# Patient Record
Sex: Male | Born: 1985 | Race: Black or African American | Hispanic: No | Marital: Single | Smoking: Current some day smoker
Health system: Southern US, Community
[De-identification: ages and names within clinical notes are randomized; demographics above are authoritative.]

---

## 2009-12-18 ENCOUNTER — Emergency Department (HOSPITAL_COMMUNITY): Admission: EM | Admit: 2009-12-18 | Discharge: 2009-12-18 | Payer: Self-pay | Admitting: Emergency Medicine

## 2010-07-21 ENCOUNTER — Emergency Department (HOSPITAL_COMMUNITY)
Admission: EM | Admit: 2010-07-21 | Discharge: 2010-07-22 | Payer: Self-pay | Source: Home / Self Care | Admitting: Emergency Medicine

## 2014-09-08 ENCOUNTER — Encounter (HOSPITAL_COMMUNITY): Payer: Self-pay | Admitting: *Deleted

## 2014-09-08 ENCOUNTER — Emergency Department (HOSPITAL_COMMUNITY): Payer: 59

## 2014-09-08 ENCOUNTER — Emergency Department (HOSPITAL_COMMUNITY)
Admission: EM | Admit: 2014-09-08 | Discharge: 2014-09-08 | Disposition: A | Discharge status: Home / Self Care | Payer: 59 | Attending: Emergency Medicine | Admitting: Emergency Medicine

## 2014-09-08 DIAGNOSIS — Z87891 Personal history of nicotine dependence: Secondary | ICD-10-CM | POA: Diagnosis not present

## 2014-09-08 DIAGNOSIS — R05 Cough: Secondary | ICD-10-CM | POA: Diagnosis present

## 2014-09-08 DIAGNOSIS — J159 Unspecified bacterial pneumonia: Secondary | ICD-10-CM | POA: Insufficient documentation

## 2014-09-08 DIAGNOSIS — J189 Pneumonia, unspecified organism: Secondary | ICD-10-CM

## 2014-09-08 LAB — COMPREHENSIVE METABOLIC PANEL
ALBUMIN: 4.3 g/dL (ref 3.5–5.2)
ALT: 24 U/L (ref 0–53)
AST: 20 U/L (ref 0–37)
Alkaline Phosphatase: 48 U/L (ref 39–117)
Anion gap: 12 (ref 5–15)
BUN: 9 mg/dL (ref 6–23)
CALCIUM: 8.9 mg/dL (ref 8.4–10.5)
CHLORIDE: 102 mmol/L (ref 96–112)
CO2: 22 mmol/L (ref 19–32)
Creatinine, Ser: 1 mg/dL (ref 0.50–1.35)
GFR calc Af Amer: >90 mL/min (ref >90)
GLUCOSE: 100 mg/dL — AB (ref 70–99)
POTASSIUM: 3.7 mmol/L (ref 3.5–5.1)
SODIUM: 136 mmol/L (ref 135–145)
TOTAL PROTEIN: 7.9 g/dL (ref 6.0–8.3)
Total Bilirubin: 1.5 mg/dL — ABNORMAL HIGH (ref 0.3–1.2)

## 2014-09-08 LAB — CBC WITH DIFFERENTIAL/PLATELET
BASOS ABS: 0 10*3/uL (ref 0.0–0.1)
Basophils Relative: 0 % (ref 0–1)
EOS PCT: 0 % (ref 0–5)
Eosinophils Absolute: 0 10*3/uL (ref 0.0–0.7)
HEMATOCRIT: 40.4 % (ref 39.0–52.0)
Hemoglobin: 13.4 g/dL (ref 13.0–17.0)
LYMPHS PCT: 7 % — AB (ref 12–46)
Lymphs Abs: 0.7 10*3/uL (ref 0.7–4.0)
MCH: 29.1 pg (ref 26.0–34.0)
MCHC: 33.2 g/dL (ref 30.0–36.0)
MCV: 87.8 fL (ref 78.0–100.0)
MONO ABS: 1.3 10*3/uL — AB (ref 0.1–1.0)
Monocytes Relative: 13 % — ABNORMAL HIGH (ref 3–12)
Neutro Abs: 7.7 10*3/uL (ref 1.7–7.7)
Neutrophils Relative %: 80 % — ABNORMAL HIGH (ref 43–77)
Platelets: 242 10*3/uL (ref 150–400)
RBC: 4.6 MIL/uL (ref 4.22–5.81)
RDW: 12.2 % (ref 11.5–15.5)
WBC: 9.6 10*3/uL (ref 4.0–10.5)

## 2014-09-08 LAB — I-STAT CG4 LACTIC ACID, ED: Lactic Acid, Venous: 0.83 mmol/L (ref 0.5–2.0)

## 2014-09-08 MED ORDER — ACETAMINOPHEN 325 MG PO TABS
650.0000 mg | ORAL_TABLET | Freq: Four times a day (QID) | ORAL | Status: DC | PRN
  Administered 2014-09-08: 650 mg via ORAL

## 2014-09-08 MED ORDER — ACETAMINOPHEN 325 MG PO TABS
ORAL_TABLET | ORAL | Status: AC
  Filled 2014-09-08: qty 2

## 2014-09-08 MED ORDER — AZITHROMYCIN 250 MG PO TABS: 250.0000 mg | ORAL_TABLET | Freq: Every day | ORAL | Status: AC

## 2014-09-08 MED ORDER — ALBUTEROL SULFATE HFA 108 (90 BASE) MCG/ACT IN AERS
2.0000 | INHALATION_SPRAY | RESPIRATORY_TRACT | Status: DC | PRN
  Administered 2014-09-08: 2 via RESPIRATORY_TRACT
  Filled 2014-09-08: qty 6.7

## 2014-09-08 MED ORDER — AZITHROMYCIN 250 MG PO TABS
500.0000 mg | ORAL_TABLET | Freq: Once | ORAL | Status: AC
  Administered 2014-09-08: 500 mg via ORAL
  Filled 2014-09-08: qty 2

## 2014-09-08 NOTE — ED Notes (Signed)
Pt in c/o cough and congestion with fever since yesterday, also reports episode of n/v tonight, last had tylenol around 10am today, no distress noted

## 2014-09-08 NOTE — ED Provider Notes (Signed)
CSN: [id number]     Arrival date & time 09/08/14  2043 History  This chart was scribed for non-physician practitioner Ivar Drapeob Shakeela Rabadan, PA-C working with Rolland PorterMark James, MD by Murriel HopperAlec Bankhead, ED Scribe. This patient was seen in room TR08C/TR08C and the patient's care was started at 10:08 PM.    Chief Complaint  Patient presents with  . Cough  . Fever      The history is provided by the patient. No language interpreter was used.     HPI Comments: Juan RoughBrian Tarry is a 29 y.o. male who presents to the Emergency Department complaining of worsening cough with associated congestion, fever, and chills that has been present since yesterday. Pt states that last night while he was at work he began to get chills and woke up this morning feeling worse. Pt states that his son has had similar symptoms lately. Pt also reports having nausea and on episode of vomiting tonight. Patient denies any recent hospitalizations or surgeries. Denies any history of asthma or COPD. He states that he does smoke occasionally.    History reviewed. No pertinent past medical history. History reviewed. No pertinent past surgical history. History reviewed. No pertinent family history. History  Substance Use Topics  . Smoking status: Former Games developermoker  . Smokeless tobacco: Not on file  . Alcohol Use: Not on file    Review of Systems  Constitutional: Positive for fever and chills.  HENT: Positive for congestion.   Respiratory: Positive for cough.   Gastrointestinal: Positive for vomiting.      Allergies  Review of patient's allergies indicates no known allergies.  Home Medications   Prior to Admission medications   Not on File   BP 127/69 mmHg  Pulse 113  Temp(Src) 100.5 F (38.1 C) (Oral)  Resp 18  Ht 5\' 8"  (1.727 m)  Wt 200 lb (90.719 kg)  BMI 30.42 kg/m2  SpO2 97% Physical Exam  Constitutional: He is oriented to person, place, and time. He appears well-developed and well-nourished.  HENT:  Head:  Normocephalic and atraumatic.  Eyes: Conjunctivae and EOM are normal. Pupils are equal, round, and reactive to light. Right eye exhibits no discharge. Left eye exhibits no discharge. No scleral icterus.  Neck: Normal range of motion. Neck supple. No JVD present.  Cardiovascular: Normal rate, regular rhythm and normal heart sounds.  Exam reveals no gallop and no friction rub.   No murmur heard. Pulmonary/Chest: Effort normal and breath sounds normal. No respiratory distress. He has no wheezes. He has no rales. He exhibits no tenderness.  Clear to auscultation bilaterally  Abdominal: Soft. He exhibits no distension and no mass. There is no tenderness. There is no rebound and no guarding.  Musculoskeletal: Normal range of motion. He exhibits no edema or tenderness.  Neurological: He is alert and oriented to person, place, and time.  Skin: Skin is warm and dry.  Psychiatric: He has a normal mood and affect. His behavior is normal. Judgment and thought content normal.  Nursing note and vitals reviewed.   ED Course  Procedures (including critical care time)  DIAGNOSTIC STUDIES: Oxygen Saturation is 97% on RA, normal by my interpretation.    COORDINATION OF CARE: 10:11 PM Discussed treatment plan with pt at bedside and pt agreed to plan.   Results for orders placed or performed during the hospital encounter of 09/08/14  CBC with Differential  Result Value Ref Range   WBC 9.6 4.0 - 10.5 K/uL   RBC 4.60 4.22 - 5.81 MIL/uL  Hemoglobin 13.4 13.0 - 17.0 g/dL   HCT 96.2 95.2 - 84.1 %   MCV 87.8 78.0 - 100.0 fL   MCH 29.1 26.0 - 34.0 pg   MCHC 33.2 30.0 - 36.0 g/dL   RDW 32.4 40.1 - 02.7 %   Platelets 242 150 - 400 K/uL   Neutrophils Relative % 80 (H) 43 - 77 %   Neutro Abs 7.7 1.7 - 7.7 K/uL   Lymphocytes Relative 7 (L) 12 - 46 %   Lymphs Abs 0.7 0.7 - 4.0 K/uL   Monocytes Relative 13 (H) 3 - 12 %   Monocytes Absolute 1.3 (H) 0.1 - 1.0 K/uL   Eosinophils Relative 0 0 - 5 %    Eosinophils Absolute 0.0 0.0 - 0.7 K/uL   Basophils Relative 0 0 - 1 %   Basophils Absolute 0.0 0.0 - 0.1 K/uL  Comprehensive metabolic panel  Result Value Ref Range   Sodium 136 135 - 145 mmol/L   Potassium 3.7 3.5 - 5.1 mmol/L   Chloride 102 96 - 112 mmol/L   CO2 22 19 - 32 mmol/L   Glucose, Bld 100 (H) 70 - 99 mg/dL   BUN 9 6 - 23 mg/dL   Creatinine, Ser 2.53 0.50 - 1.35 mg/dL   Calcium 8.9 8.4 - 66.4 mg/dL   Total Protein 7.9 6.0 - 8.3 g/dL   Albumin 4.3 3.5 - 5.2 g/dL   AST 20 0 - 37 U/L   ALT 24 0 - 53 U/L   Alkaline Phosphatase 48 39 - 117 U/L   Total Bilirubin 1.5 (H) 0.3 - 1.2 mg/dL   GFR calc non Af Amer >90 >90 mL/min   GFR calc Af Amer >90 >90 mL/min   Anion gap 12 5 - 15  I-Stat CG4 Lactic Acid, ED  Result Value Ref Range   Lactic Acid, Venous 0.83 0.5 - 2.0 mmol/L   Dg Chest 2 View  09/08/2014   CLINICAL DATA:  29 year old male with cough congestion and fever for 1 day.  EXAM: CHEST  2 VIEW  COMPARISON:  12/18/2009  FINDINGS: The cardiomediastinal silhouette is unremarkable.  Mild patchy right lower lobe opacities likely represents pneumonia.  There is no evidence of pulmonary edema, suspicious pulmonary nodule/mass, pleural effusion, or pneumothorax. No acute bony abnormalities are identified.  IMPRESSION: Probable right lower lobe pneumonia.   Electronically Signed   By: Harmon Pier M.D.   On: 09/08/2014 21:52     Imaging Review Dg Chest 2 View  09/08/2014   CLINICAL DATA:  29 year old male with cough congestion and fever for 1 day.  EXAM: CHEST  2 VIEW  COMPARISON:  12/18/2009  FINDINGS: The cardiomediastinal silhouette is unremarkable.  Mild patchy right lower lobe opacities likely represents pneumonia.  There is no evidence of pulmonary edema, suspicious pulmonary nodule/mass, pleural effusion, or pneumothorax. No acute bony abnormalities are identified.  IMPRESSION: Probable right lower lobe pneumonia.   Electronically Signed   By: Harmon Pier M.D.   On:  09/08/2014 21:52     EKG Interpretation None      MDM   Final diagnoses:  CAP (community acquired pneumonia)    Patient with fever, cough and congestion. Will check chest x-ray and basic labs.  Chest x-ray remarkable for a right-sided pneumonia. This is consistent with the patient's symptoms. Will treat with azithromycin for community-acquired pneumonia in an uncomplicated patient. Will give the patient his first dose of azithromycin tonight. Recommend outpatient follow-up. Return precautions given. Patient  understands and agrees with the plan. Advised increasing fluids and rest for several days.  I personally performed the services described in this documentation, which was scribed in my presence. The recorded information has been reviewed and is accurate.  Filed Vitals:   09/08/14 2230  BP: 112/57  Pulse: 95  Temp: 99 F (37.2 C)  Resp: [address] PA-C 09/08/14 2238  Rolland Porter, MD 09/09/14 2211

## 2014-09-08 NOTE — Discharge Instructions (Signed)

## 2014-09-08 NOTE — ED Notes (Signed)
PA assessment and discharge prior to RN assessment.

## 2015-10-14 ENCOUNTER — Emergency Department (HOSPITAL_COMMUNITY): Payer: Commercial Managed Care - HMO

## 2015-10-14 ENCOUNTER — Encounter (HOSPITAL_COMMUNITY): Payer: Self-pay | Admitting: *Deleted

## 2015-10-14 ENCOUNTER — Emergency Department (HOSPITAL_COMMUNITY)
Admission: EM | Admit: 2015-10-14 | Discharge: 2015-10-14 | Disposition: A | Discharge status: Home / Self Care | Payer: Commercial Managed Care - HMO | Attending: Emergency Medicine | Admitting: Emergency Medicine

## 2015-10-14 DIAGNOSIS — Y9289 Other specified places as the place of occurrence of the external cause: Secondary | ICD-10-CM | POA: Diagnosis not present

## 2015-10-14 DIAGNOSIS — Y990 Civilian activity done for income or pay: Secondary | ICD-10-CM | POA: Diagnosis not present

## 2015-10-14 DIAGNOSIS — M25569 Pain in unspecified knee: Secondary | ICD-10-CM

## 2015-10-14 DIAGNOSIS — Z792 Long term (current) use of antibiotics: Secondary | ICD-10-CM | POA: Diagnosis not present

## 2015-10-14 DIAGNOSIS — S83512A Sprain of anterior cruciate ligament of left knee, initial encounter: Secondary | ICD-10-CM | POA: Diagnosis not present

## 2015-10-14 DIAGNOSIS — Y9389 Activity, other specified: Secondary | ICD-10-CM | POA: Insufficient documentation

## 2015-10-14 DIAGNOSIS — F17210 Nicotine dependence, cigarettes, uncomplicated: Secondary | ICD-10-CM | POA: Insufficient documentation

## 2015-10-14 DIAGNOSIS — M25562 Pain in left knee: Secondary | ICD-10-CM

## 2015-10-14 DIAGNOSIS — S8992XA Unspecified injury of left lower leg, initial encounter: Secondary | ICD-10-CM | POA: Diagnosis present

## 2015-10-14 DIAGNOSIS — X58XXXA Exposure to other specified factors, initial encounter: Secondary | ICD-10-CM | POA: Insufficient documentation

## 2015-10-14 MED ORDER — OXYCODONE-ACETAMINOPHEN 5-325 MG PO TABS: 1.0000 | ORAL_TABLET | Freq: Three times a day (TID) | ORAL | Status: AC | PRN

## 2015-10-14 MED ORDER — OXYCODONE-ACETAMINOPHEN 5-325 MG PO TABS
1.0000 | ORAL_TABLET | Freq: Once | ORAL | Status: AC
  Administered 2015-10-14: 1 via ORAL
  Filled 2015-10-14: qty 1

## 2015-10-14 NOTE — Discharge Instructions (Signed)
Please read and follow all provided instructions.  Your diagnoses today include:  1. Left knee pain   2. Knee pain   3. ACL sprain, left, initial encounter    Tests performed today include:  Vital signs. See below for your results today.   Medications prescribed:   Take as prescribed   You have been prescribed a narcotic medication on an "as needed" basis. Take only as prescribed. Do not drive, operate any machinery or make any important decisions while taking this medication as it is sedating. It may cause constipation take over the counter stool softeners or add fiber to your diet to treat this (Metamucil, Psyllium Fiber, Colace, Miralax) Further refills will need to be obtained from your primary care doctor and will not be prescribed through the Emergency Department. You will test positive on most drug tests while taking this medciation.   Home care instructions:  Follow any educational materials contained in this packet.  Follow-up instructions: Please follow-up with your Orthopedics for further evaluation of symptoms and treatment   Return instructions:   Please return to the Emergency Department if you do not get better, if you get worse, or new symptoms OR  - Fever (temperature greater than 101.21F)  - Bleeding that does not stop with holding pressure to the area    -Severe pain (please note that you may be more sore the day after your accident)  - Chest Pain  - Difficulty breathing  - Severe nausea or vomiting  - Inability to tolerate food and liquids  - Passing out  - Skin becoming red around your wounds  - Change in mental status (confusion or lethargy)  - New numbness or weakness     Please return if you have any other emergent concerns.  Additional Information:  Your vital signs today were: BP 121/58 mmHg   Pulse 90   Temp(Src) 98.8 F (37.1 C) (Oral)   Resp 16   Ht 5\' 9"  (1.753 m)   Wt 85.73 kg   BMI 27.90 kg/m2   SpO2 100% If your blood pressure (BP) was  elevated above 135/85 this visit, please have this repeated by your doctor within one month. ---------------

## 2015-10-14 NOTE — ED Provider Notes (Signed)
CSN: [id number]     Arrival date & time 10/14/15  1739 History  By signing my name below, I, Doreatha MartinEva Mathews, attest that this documentation has been prepared under the direction and in the presence of Audry Piliyler Aleida Crandell, PA-C. Electronically Signed: Doreatha MartinEva Mathews, ED Scribe. 10/14/2015. 5:59 PM.    Chief Complaint  Patient presents with  . Joint Swelling   The history is provided by the patient. No language interpreter was used.   HPI Comments: Juan RoughBrian Fratus is a 30 y.o. male who presents to the Emergency Department complaining of moderate 7/10 left knee pain and swelling initially onset 2 weeks ago and worsened 2 days ago. Pt states that his pain began initially after stepping out of the shower onto the knee 2 weeks ago and recurred while walking on uneven ground at work 2 days ago. He reports that he wore an over the counter knee brace after initial injury and saw relief of symptoms before his pain recurred. Denies any other recent trauma, injury or fall. Pt states his pain is worsened after periods of immobilization and improved after ambulation and stretching. Pt is ambulatory with minimal difficulty. He states his pain is worsened with weight bearing. He has tried tylenol, aleve, icy hot, RICE method with moderate relief of symptoms. Denies numbness, paresthesia. No h/o ligament tear. Pt is not currently followed by ortho.   History reviewed. No pertinent past medical history. History reviewed. No pertinent past surgical history. History reviewed. No pertinent family history. Social History  Substance Use Topics  . Smoking status: Current Some Day Smoker    Types: Cigarettes  . Smokeless tobacco: None  . Alcohol Use: No    Review of Systems A complete 10 system review of systems was obtained and all systems are negative except as noted in the HPI and PMH.    Allergies  Review of patient's allergies indicates no known allergies.  Home Medications   Prior to Admission medications   Medication  Sig Start Date End Date Taking? Authorizing Provider  azithromycin (ZITHROMAX Z-PAK) 250 MG tablet Take 1 tablet (250 mg total) by mouth daily. 500mg  PO day 1, then 250mg  PO days 205 09/08/14   Roxy Horsemanobert Browning, PA-C   BP 121/58 mmHg  Pulse 90  Temp(Src) 98.8 F (37.1 C) (Oral)  Resp 16  Ht 5\' 9"  (1.753 m)  Wt 189 lb (85.73 kg)  BMI 27.90 kg/m2  SpO2 100%   Physical Exam  Constitutional: He is oriented to person, place, and time. He appears well-developed and well-nourished.  HENT:  Head: Normocephalic.  Eyes: Conjunctivae are normal.  Cardiovascular: Normal rate.   Pulmonary/Chest: Effort normal. No respiratory distress.  Abdominal: He exhibits no distension.  Musculoskeletal: Normal range of motion. He exhibits edema.  Swelling to the medial side of the left knee. No laxity to ACL or PCL. No erythema.   Neurological: He is alert and oriented to person, place, and time. Coordination normal.  Strength and sensation equal and intact bilaterally throughout the upper and lower extremities.Normal gait. Coordination intact.    Skin: Skin is warm and dry. No erythema.  Psychiatric: He has a normal mood and affect. His behavior is normal.  Nursing note and vitals reviewed.  ED Course  Procedures (including critical care time) DIAGNOSTIC STUDIES: Oxygen Saturation is 100% on RA, normal by my interpretation.    COORDINATION OF CARE: 5:54 PM Discussed treatment plan with pt at bedside which includes XR and pt agreed to plan.   Imaging Review Dg  Knee Complete 4 Views Left  10/14/2015  CLINICAL DATA:  Stepped wrong way about 2 weeks ago, with persistent left knee pain. Heard pop at left knee 2 days ago. Initial encounter. EXAM: LEFT KNEE - COMPLETE 4+ VIEW COMPARISON:  None. FINDINGS: There is no evidence of fracture or dislocation. The joint spaces are preserved. An apparent loose body is noted at the joint space, projecting at the central aspect of the medial compartment. A relatively  large knee joint effusion is noted.  A fabella is seen. IMPRESSION: 1. No evidence of fracture or dislocation. 2. Relatively large knee joint effusion noted. Given the patient's symptoms, MRI is recommended for further evaluation. 3. Apparent loose body at the joint space, projecting at the central aspect of the medial compartment. This may reflect a focal avulsion injury or may arise from an osteochondral defect. Electronically Signed   By: Roanna Raider M.D.   On: 10/14/2015 18:38   I have personally reviewed and evaluated these images as part of my medical decision-making.   MDM  I personally evaluated andatory values.I have reviewed and evaluated the relevant imaging studies. I have reviewed the relevant previous healthcare records. I obtained HPI from historian.  ED Course:  Assessment: Patient X-Ray negative for obvious fracture or dislocation. Did show effusion and recommended MRI. Discussed preliminary MRI results with radiologist who noted possible ACL strain and lateral patellar cartilage defects. MRI read will be available tomorrow. Pt advised to follow up with orthopedics. Patient given knee immobilizer while in ED, conservative therapy recommended and discussed. Given Rx pain medication. MRI Patient will be discharged home & is agreeable with above plan. Returns precautions discussed. Pt appears safe for discharge.   Disposition/Plan:  DC home Additional Verbal discharge instructions given and discussed with patient.  Pt Instructed to f/u with Ortho in the next week for evaluation and treatment of symptoms. Return precautions given Pt acknowledges and agrees with plan  Supervising Physician Rolland Porter, MD   Final diagnoses:  Knee pain  Left knee pain  ACL sprain, left, initial encounter    I personally performed the services described in this documentation, which was scribed in my presence. The recorded information has been reviewed and is accurate.   Audry Pili,  PA-C 10/14/15 2105  Rolland Porter, MD 10/27/15 Ernestina Columbia

## 2015-10-14 NOTE — ED Notes (Signed)
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

## 2015-10-14 NOTE — ED Notes (Signed)
Patient transported to MRI 

## 2017-09-23 IMAGING — CR DG KNEE COMPLETE 4+V*L*
4 series · 4 of 4 positions shown · non-contrast
Comparison: None.

CLINICAL DATA: Stepped wrong way about 2 weeks ago, with persistent
left knee pain. Heard pop at left knee 2 days ago. Initial
encounter.

EXAM:
LEFT KNEE - COMPLETE 4+ VIEW

[knee ap]
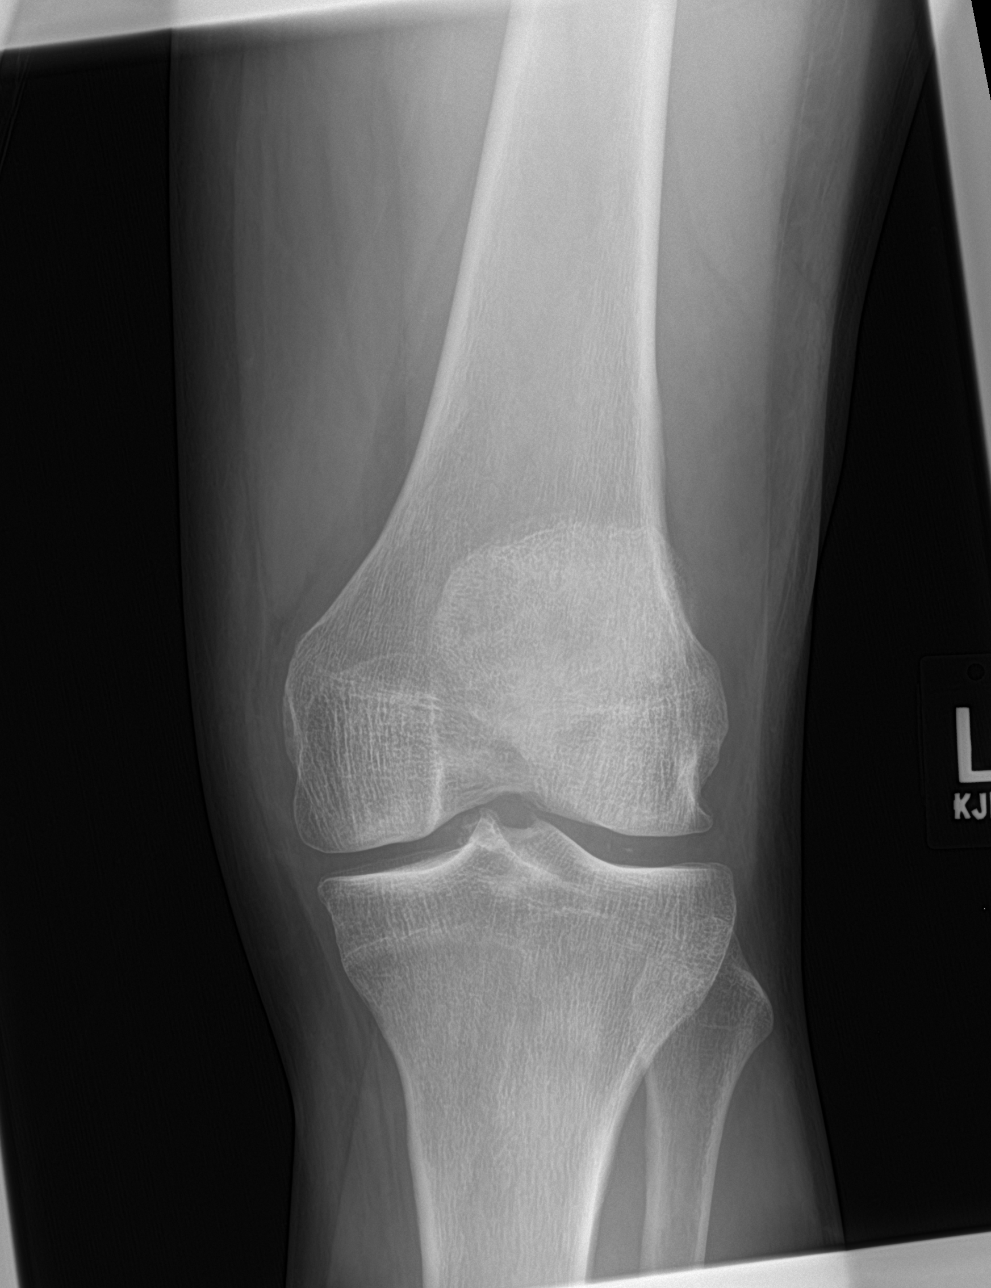

[knee lat]
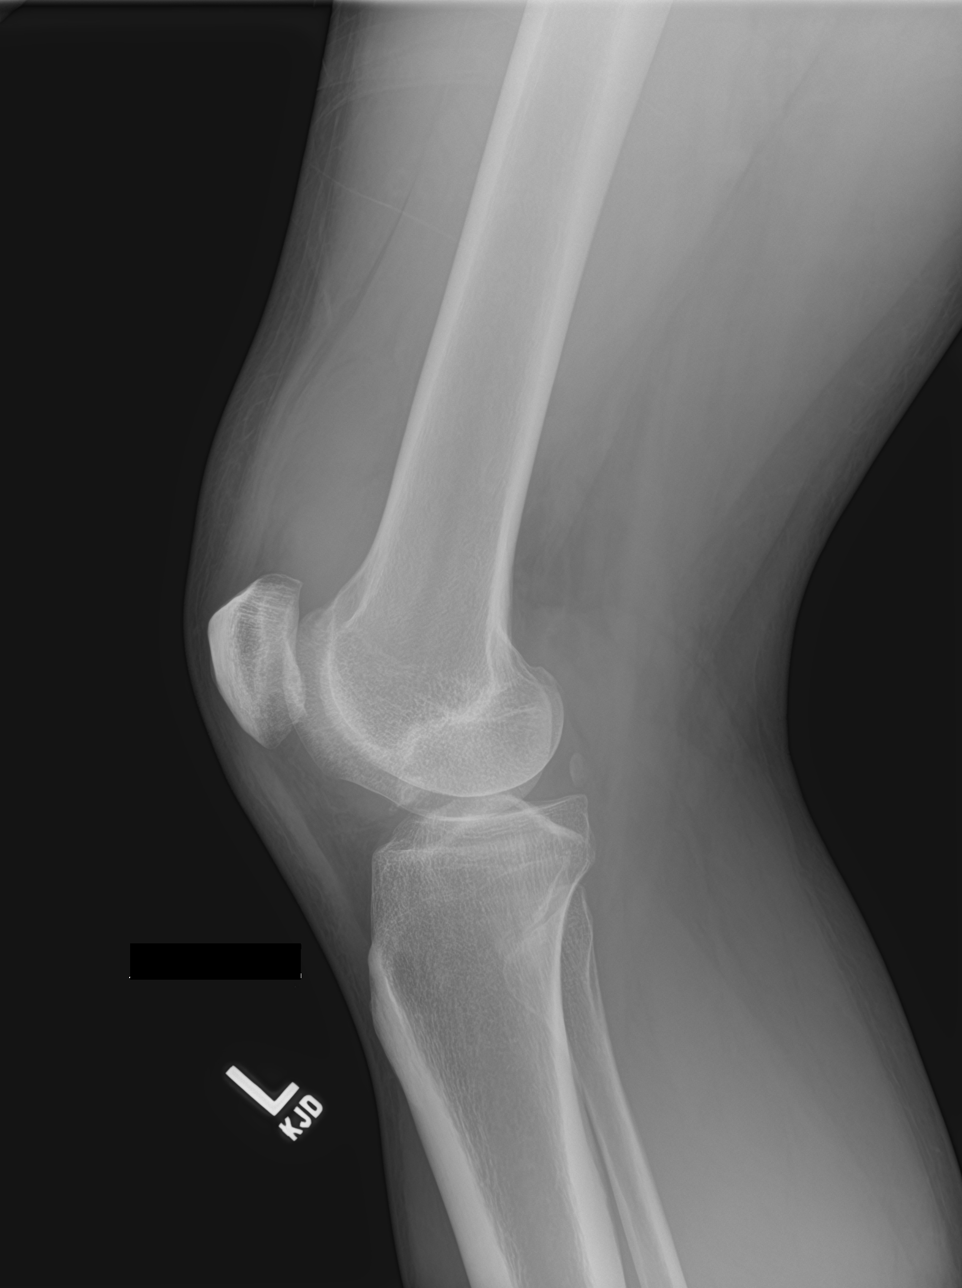

[knee obl (1 of 2)]
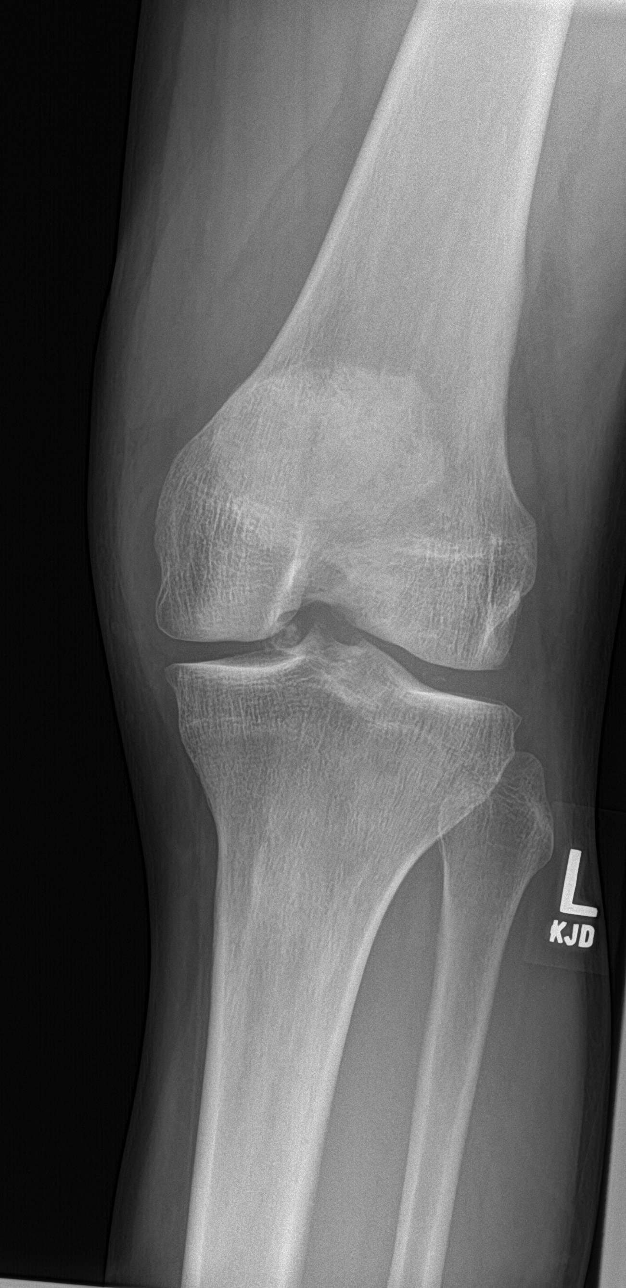

[knee obl (2 of 2)]
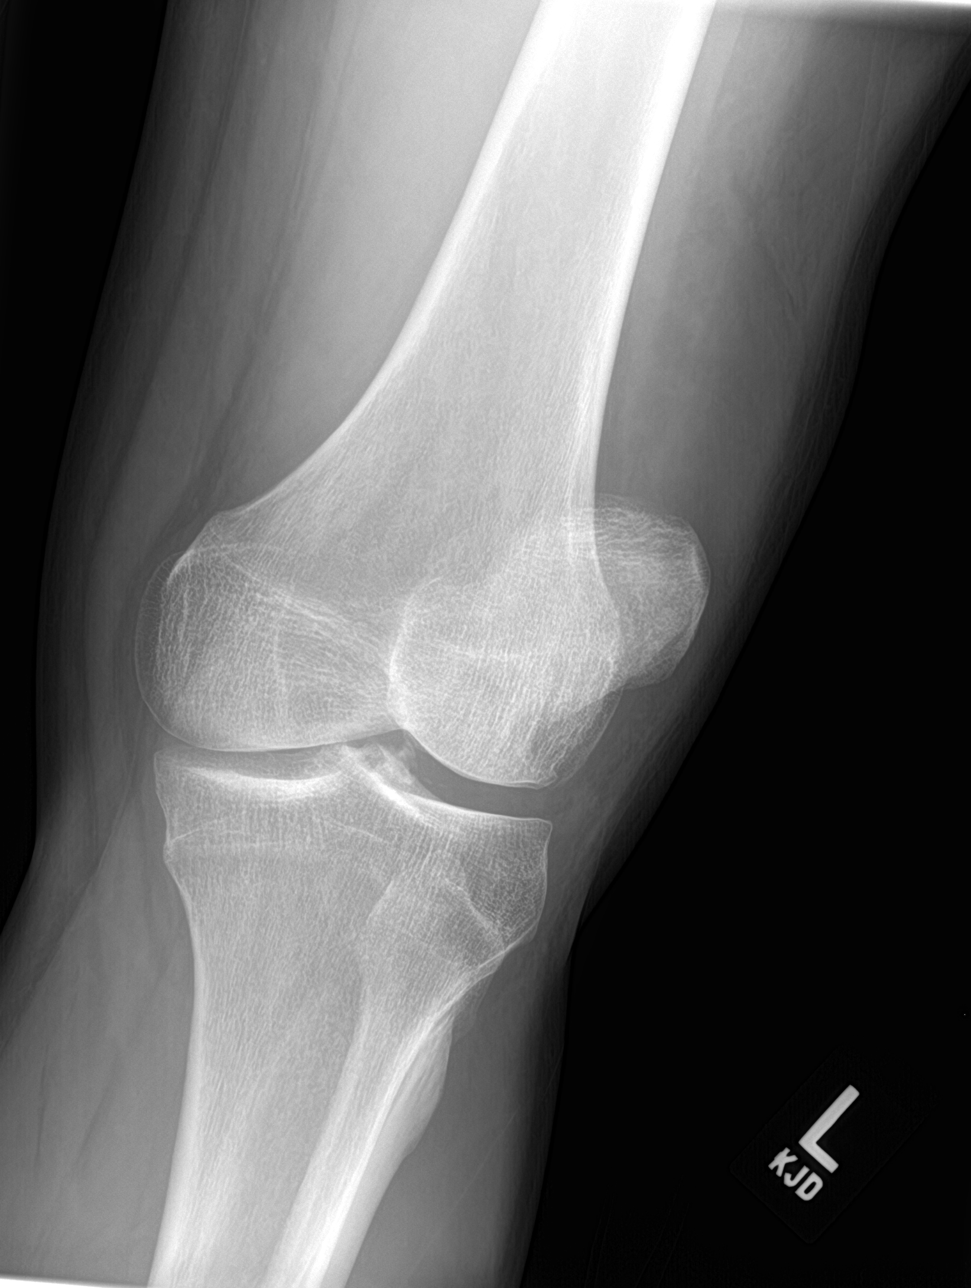

[4 of 4 positions shown; findings below may reference images not displayed]

FINDINGS: There is no evidence of fracture or dislocation. The joint spaces
are preserved. An apparent loose body is noted at the joint space,
projecting at the central aspect of the medial compartment.

A relatively large knee joint effusion is noted.  A fabella is seen.
IMPRESSION: 1. No evidence of fracture or dislocation.
2. Relatively large knee joint effusion noted. Given the patient's
symptoms, MRI is recommended for further evaluation.
3. Apparent loose body at the joint space, projecting at the central
aspect of the medial compartment. This may reflect a focal avulsion
injury or may arise from an osteochondral defect.

## 2017-12-10 ENCOUNTER — Ambulatory Visit: Payer: 59 | Admitting: Family Medicine

## 2017-12-10 DIAGNOSIS — Z0289 Encounter for other administrative examinations: Secondary | ICD-10-CM

## 2023-08-27 DIAGNOSIS — Z23 Encounter for immunization: Secondary | ICD-10-CM | POA: Diagnosis not present

## 2023-08-27 DIAGNOSIS — Z0000 Encounter for general adult medical examination without abnormal findings: Secondary | ICD-10-CM | POA: Diagnosis not present

## 2023-09-01 DIAGNOSIS — Z13228 Encounter for screening for other metabolic disorders: Secondary | ICD-10-CM | POA: Diagnosis not present

## 2023-09-01 DIAGNOSIS — Z125 Encounter for screening for malignant neoplasm of prostate: Secondary | ICD-10-CM | POA: Diagnosis not present

## 2023-09-01 DIAGNOSIS — Z13220 Encounter for screening for lipoid disorders: Secondary | ICD-10-CM | POA: Diagnosis not present

## 2023-09-01 DIAGNOSIS — Z0000 Encounter for general adult medical examination without abnormal findings: Secondary | ICD-10-CM | POA: Diagnosis not present

## 2024-04-19 DIAGNOSIS — M25551 Pain in right hip: Secondary | ICD-10-CM | POA: Diagnosis not present

## 2024-04-19 DIAGNOSIS — M5431 Sciatica, right side: Secondary | ICD-10-CM | POA: Diagnosis not present

## 2024-04-22 DIAGNOSIS — M25551 Pain in right hip: Secondary | ICD-10-CM | POA: Diagnosis not present

## 2024-04-22 DIAGNOSIS — M539 Dorsopathy, unspecified: Secondary | ICD-10-CM | POA: Diagnosis not present

## 2024-05-02 DIAGNOSIS — M25551 Pain in right hip: Secondary | ICD-10-CM | POA: Diagnosis not present

## 2024-05-02 DIAGNOSIS — M5416 Radiculopathy, lumbar region: Secondary | ICD-10-CM | POA: Diagnosis not present

## 2024-05-04 DIAGNOSIS — M25551 Pain in right hip: Secondary | ICD-10-CM | POA: Diagnosis not present

## 2024-05-04 DIAGNOSIS — M5416 Radiculopathy, lumbar region: Secondary | ICD-10-CM | POA: Diagnosis not present

## 2024-05-10 DIAGNOSIS — M25551 Pain in right hip: Secondary | ICD-10-CM | POA: Diagnosis not present

## 2024-05-10 DIAGNOSIS — M545 Low back pain, unspecified: Secondary | ICD-10-CM | POA: Diagnosis not present

## 2024-05-10 DIAGNOSIS — M5416 Radiculopathy, lumbar region: Secondary | ICD-10-CM | POA: Diagnosis not present

## 2024-05-24 DIAGNOSIS — M545 Low back pain, unspecified: Secondary | ICD-10-CM | POA: Diagnosis not present

## 2024-05-24 DIAGNOSIS — M5416 Radiculopathy, lumbar region: Secondary | ICD-10-CM | POA: Diagnosis not present

## 2024-05-24 DIAGNOSIS — M25551 Pain in right hip: Secondary | ICD-10-CM | POA: Diagnosis not present

## 2024-06-06 DIAGNOSIS — M5416 Radiculopathy, lumbar region: Secondary | ICD-10-CM | POA: Diagnosis not present

## 2024-06-10 DIAGNOSIS — M5416 Radiculopathy, lumbar region: Secondary | ICD-10-CM | POA: Diagnosis not present
